# Patient Record
Sex: Male | Born: 1985 | Race: White | Hispanic: No | Marital: Single
Health system: Southern US, Community
[De-identification: ages and names within clinical notes are randomized; demographics above are authoritative.]

---
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
## 2005-04-19 ENCOUNTER — Encounter: Admission: RE | Admit: 2005-04-19 | Discharge: 2005-04-19 | Payer: Self-pay | Admitting: Family Medicine

## 2006-11-27 IMAGING — CT CT HEAD W/O CM
1 series · 16 of 28 positions shown, 20 images · IV contrast (agent unspecified)
Comparison: none

CLINICAL DATA: 19-year-old male with syncope, head trauma, and right headache.  
 HEAD CT WITHOUT CONTRAST:
TECHNIQUE: 5mm collimated images were obtained from the base of the skull through the vertex according to standard protocol without contrast.

[Series 2: brain · axial · 0.49mm/px · z∈[+23,+151]mm · 16 of 28 slices shown, 20 images]
[im 2/28  brain]
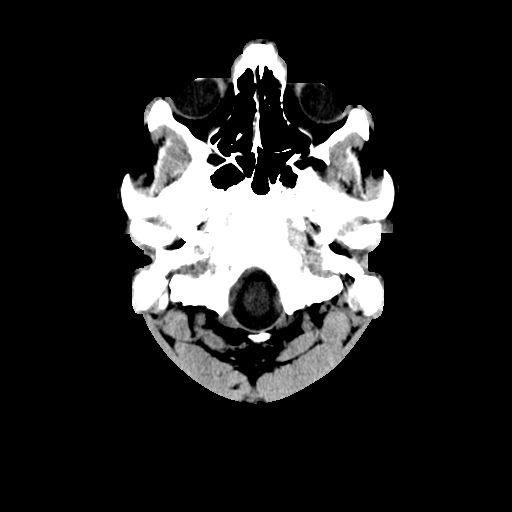
[im 2/28  bone]
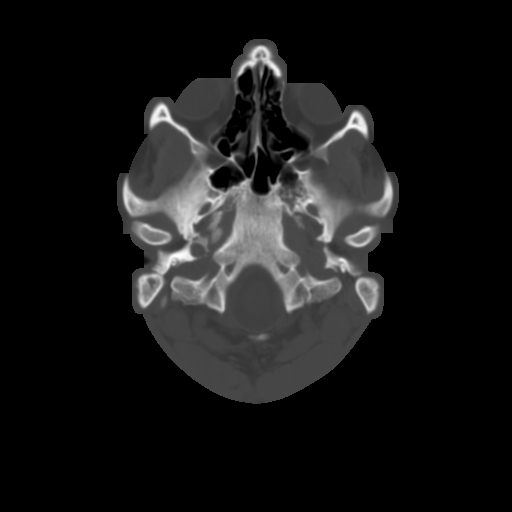
[im 4/28  brain]
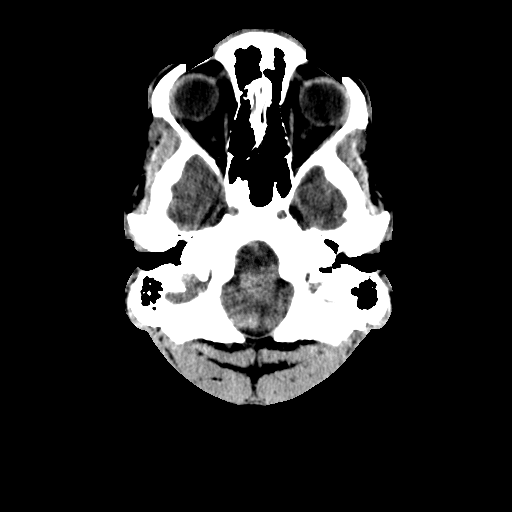
[im 6/28  brain]
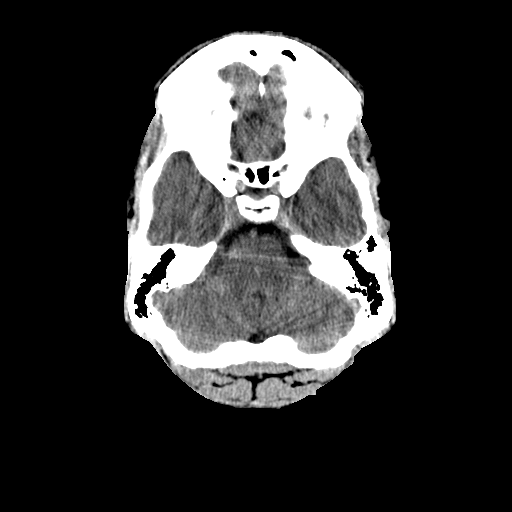
[im 7/28  brain]
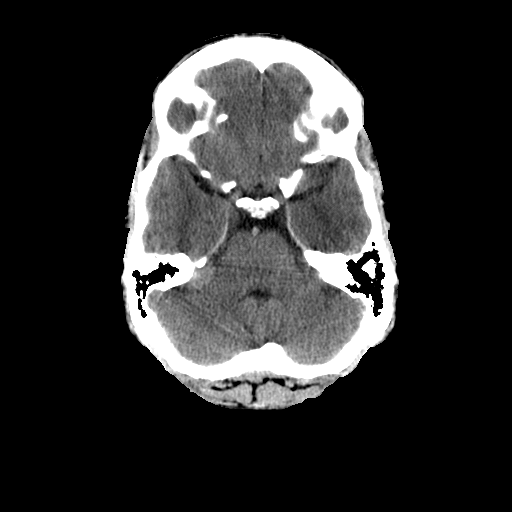
[im 9/28  brain]
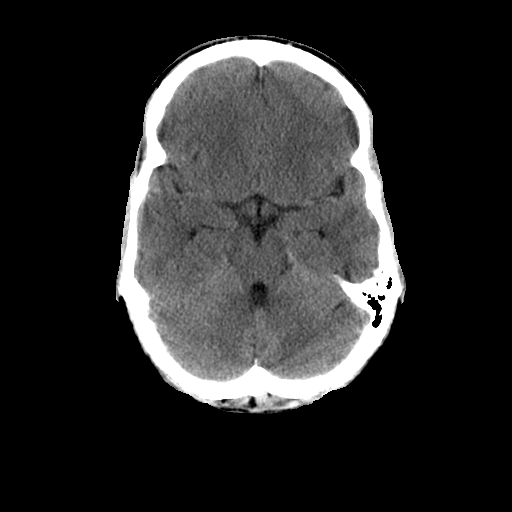
[im 9/28  bone]
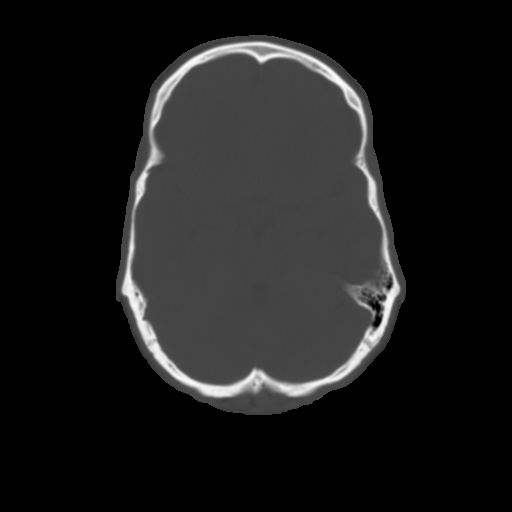
[im 10/28  brain]
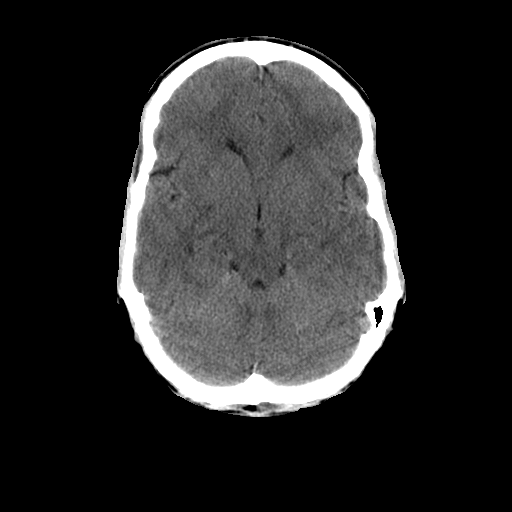
[im 12/28  brain]
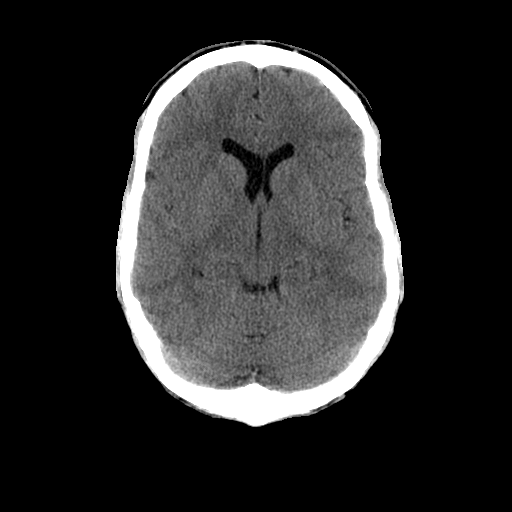
[im 14/28  brain]
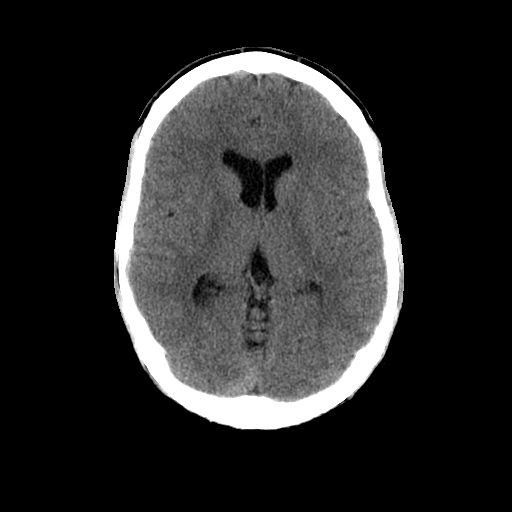
[im 15/28  brain]
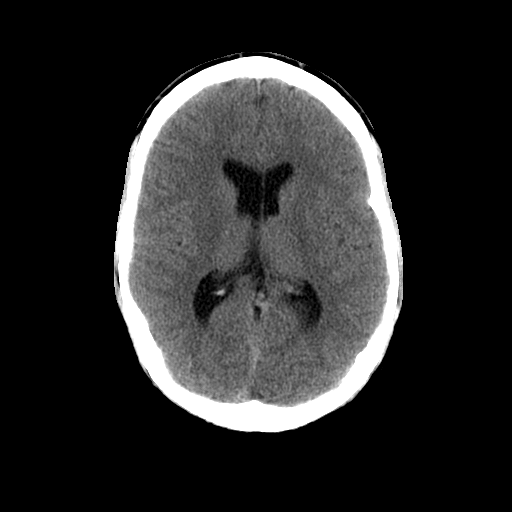
[im 15/28  bone]
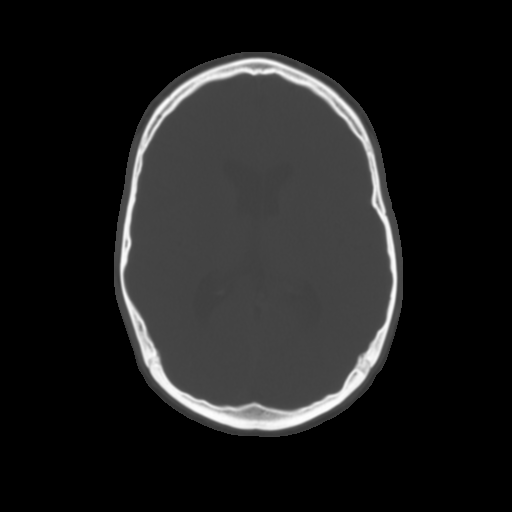
[im 17/28  brain]
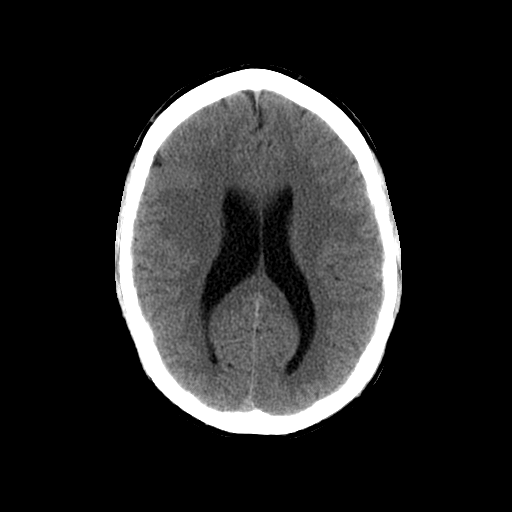
[im 19/28  brain]
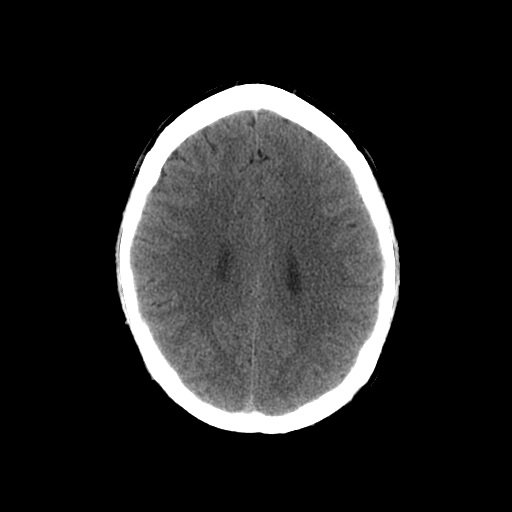
[im 20/28  brain]
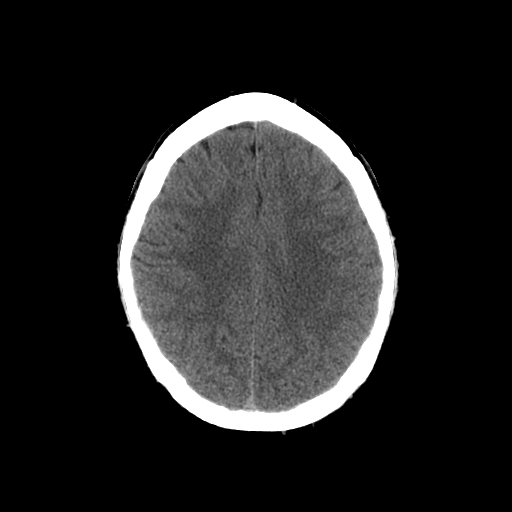
[im 22/28  brain]
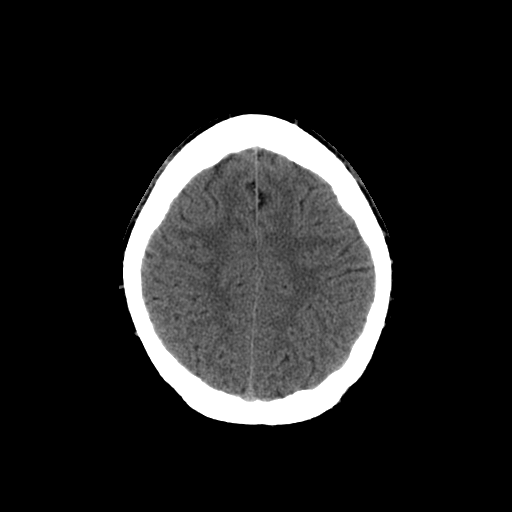
[im 22/28  bone]
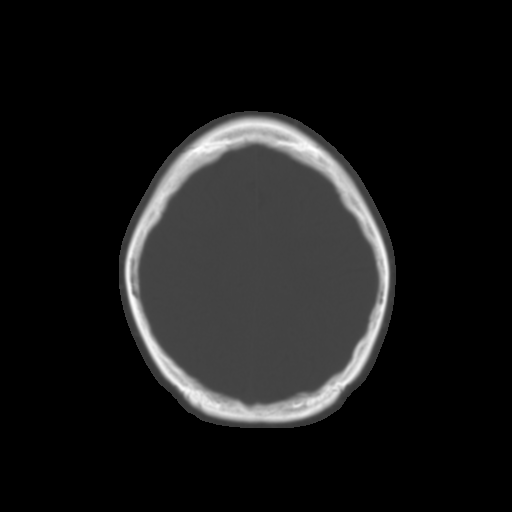
[im 23/28  brain]
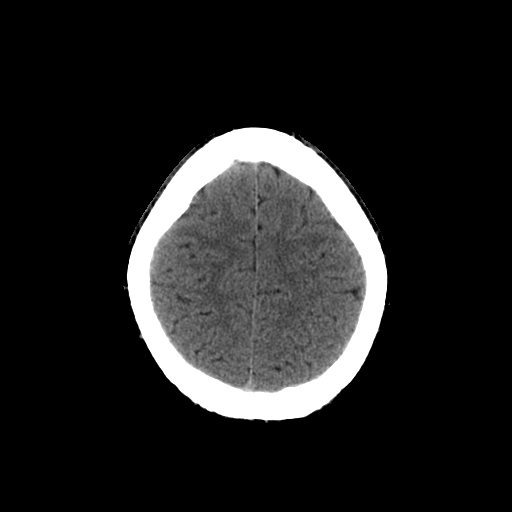
[im 25/28  brain]
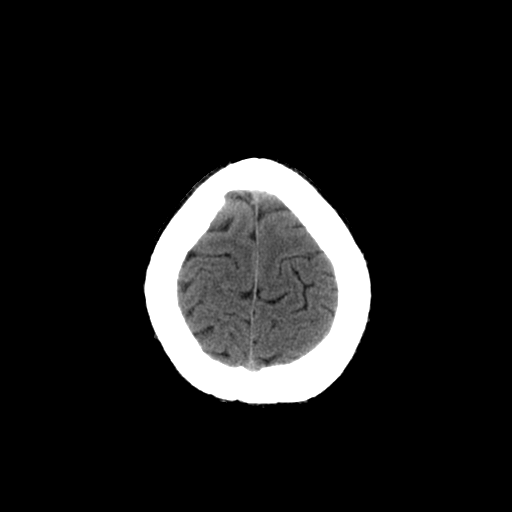
[im 27/28  brain]
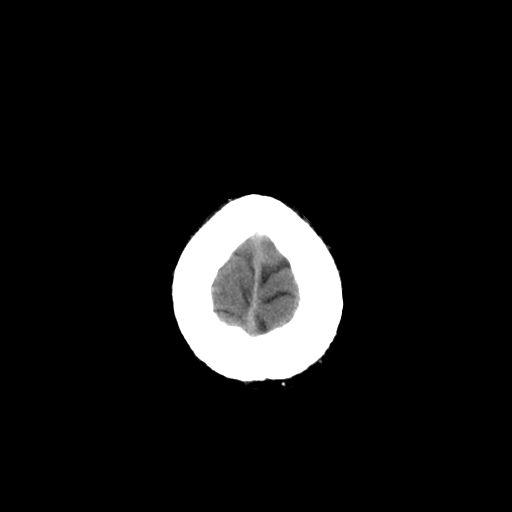

[16 of 28 positions shown; findings below may reference images not displayed]

FINDINGS: No evidence of acute intracranial abnormality including mass or mass effect, hydrocephalus, extra-axial fluid collection, midline shift, hemorrhage, or infarct.  Acute infarct may be missed by CT for 24 to 48 hours.  Visualized bony calvarium and paranasal sinuses are unremarkable.
IMPRESSION: No evidence of acute intracranial abnormality.

## 2008-02-12 ENCOUNTER — Encounter: Admission: RE | Admit: 2008-02-12 | Discharge: 2008-02-12 | Payer: Self-pay | Admitting: Family Medicine

## 2009-09-21 IMAGING — US US SCROTUM
1 series · 14 of 25 positions shown · non-contrast
Comparison: None

CLINICAL DATA: Left testicular nodule

ULTRASOUND OF SCROTUM
TECHNIQUE: Complete ultrasound examination of the testicles,
epididymis, and other scrotal structures was performed.

[Series 1: us scrotum · 0.14mm/px · 14 of 41 slices shown]
[im 1/41]
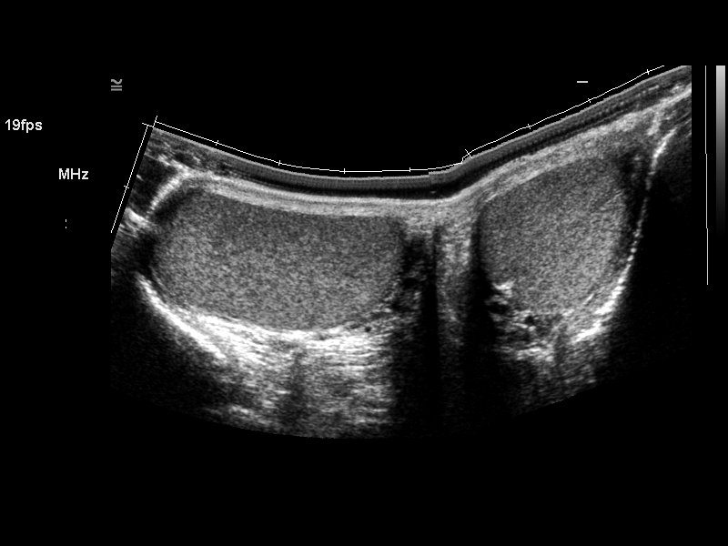
[im 4/41]
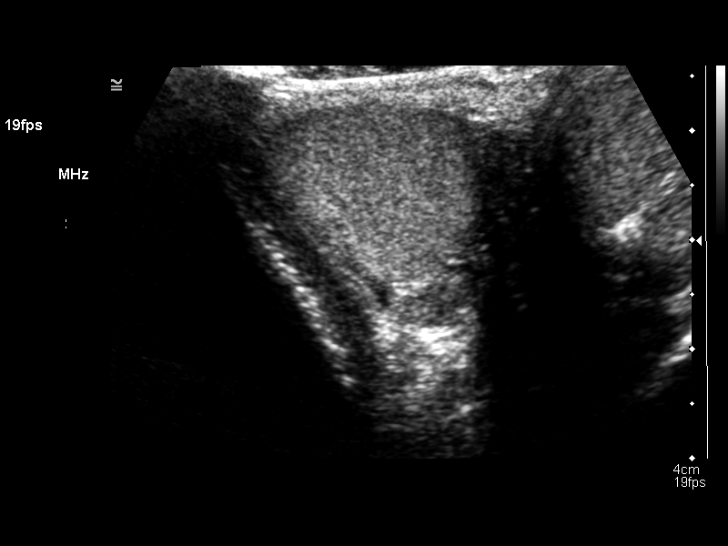
[im 7/41]
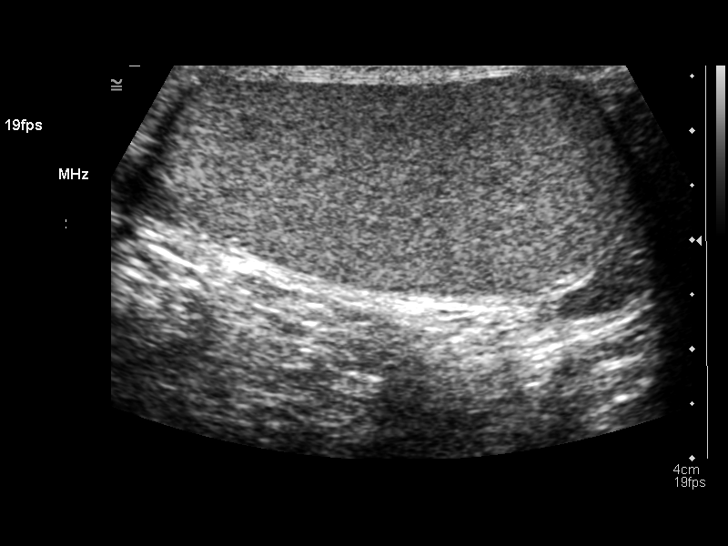
[im 11/41]
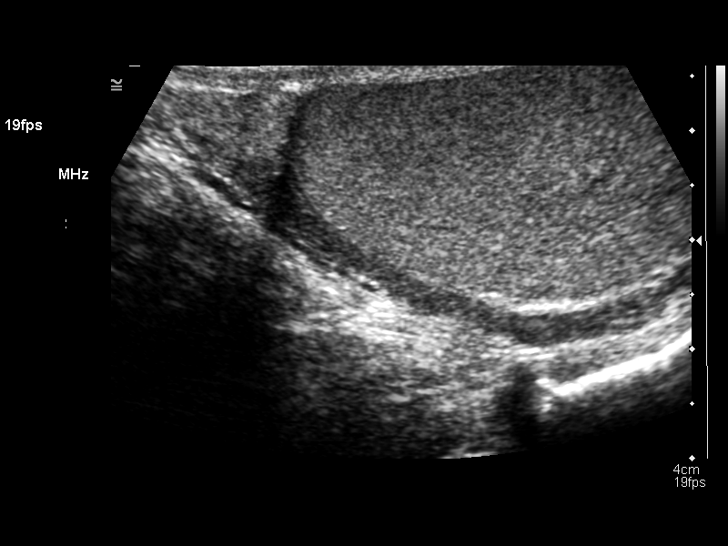
[im 14/41]
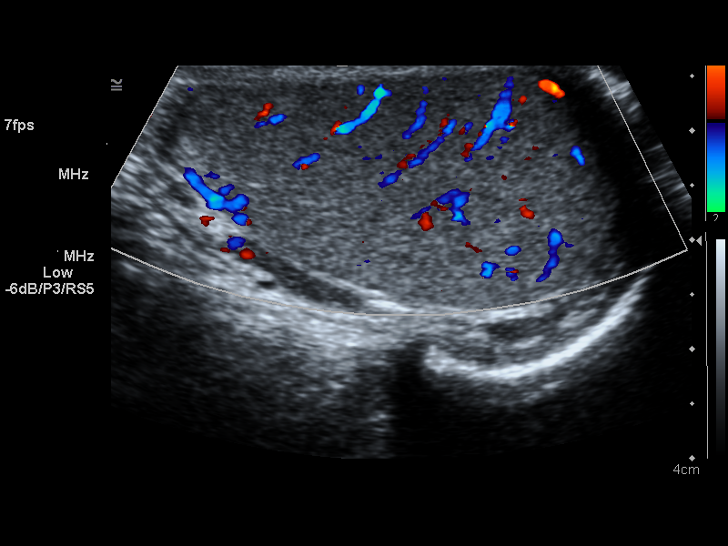
[im 16/41]
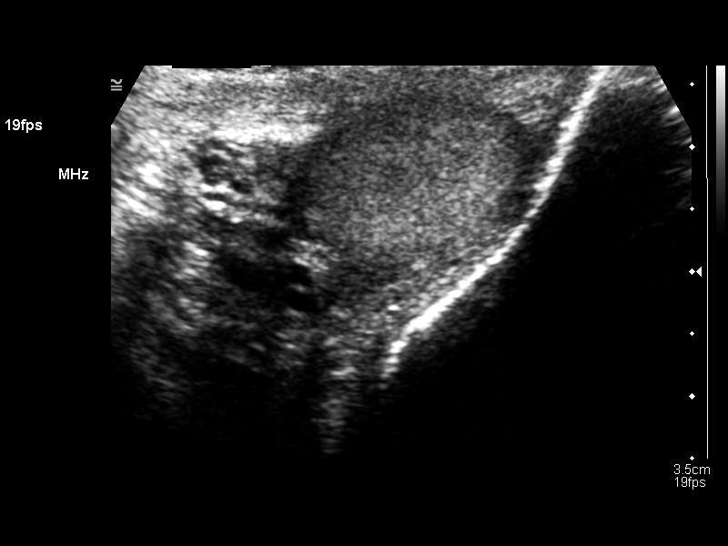
[im 19/41]
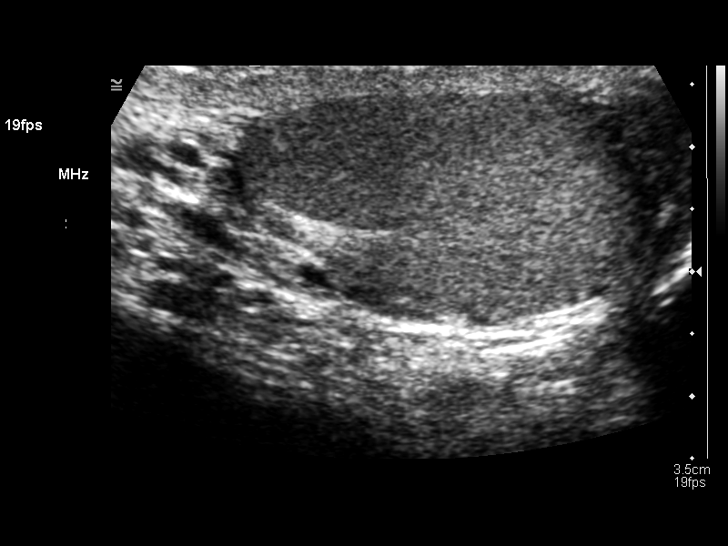
[im 22/41]
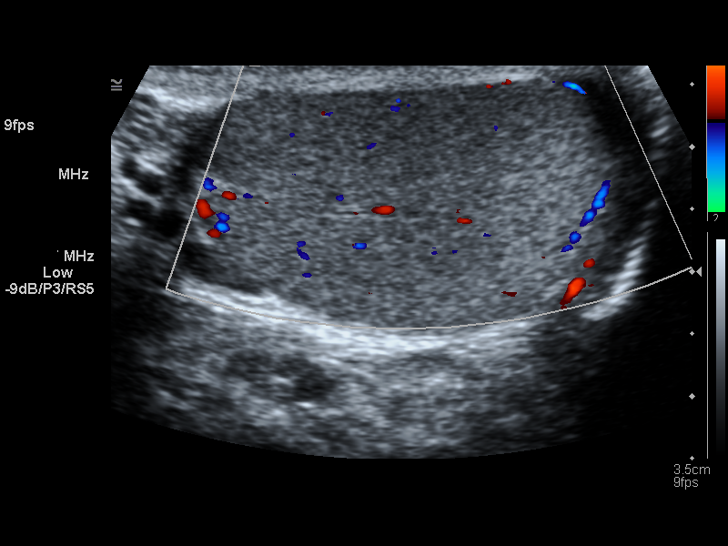
[im 26/41]
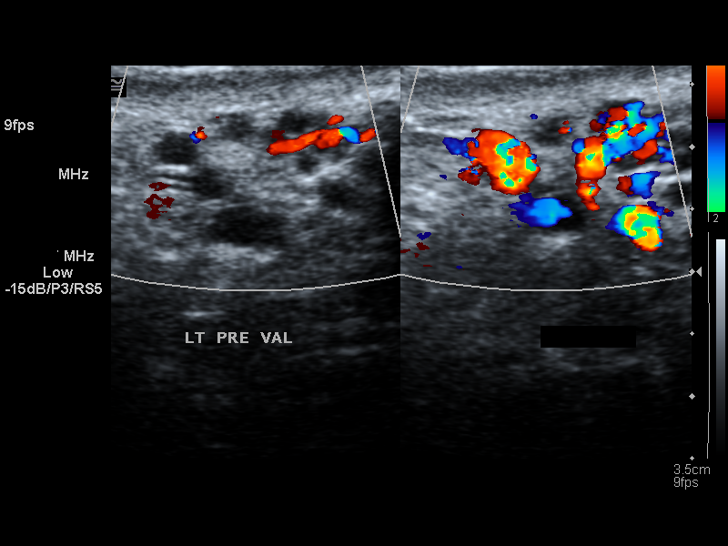
[im 27/41]
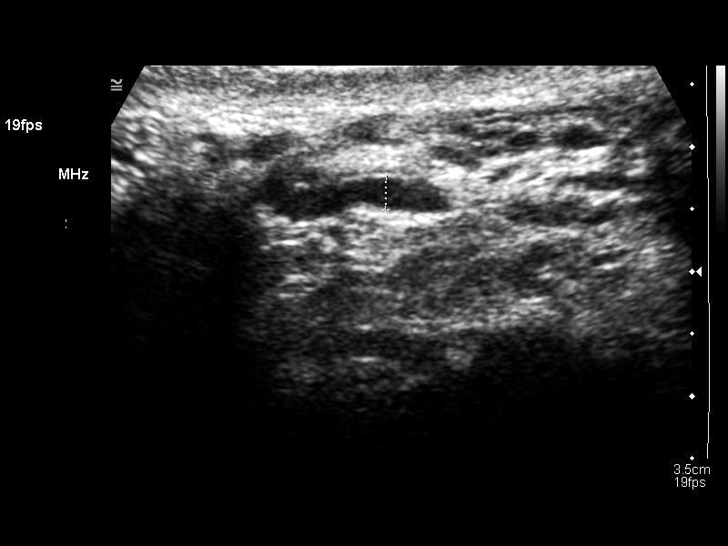
[im 31/41]
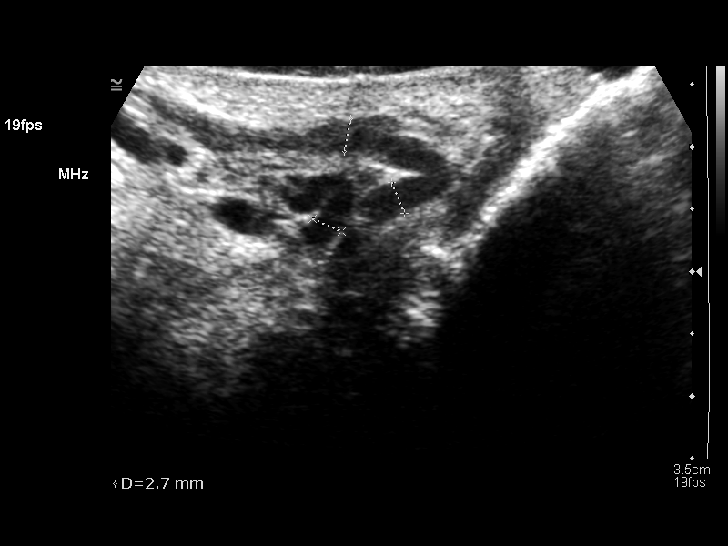
[im 34/41]
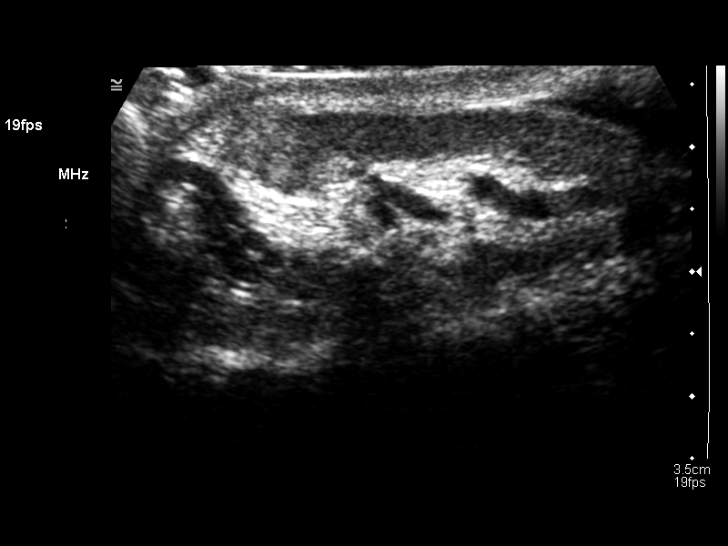
[im 37/41]
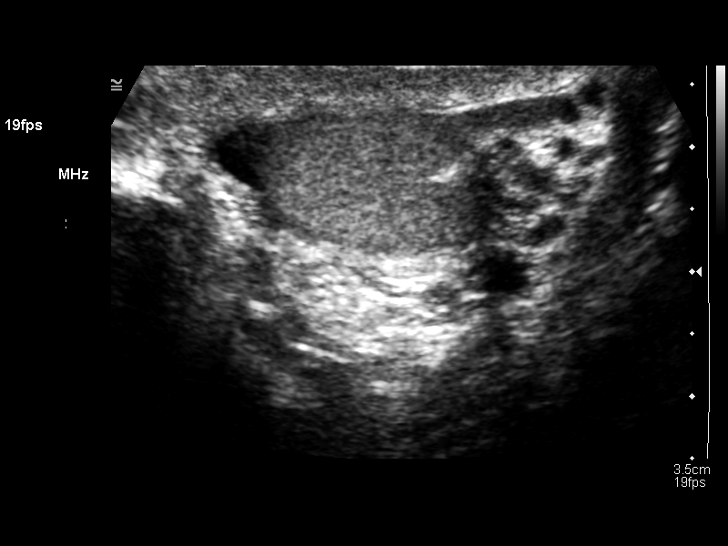
[im 41/41]
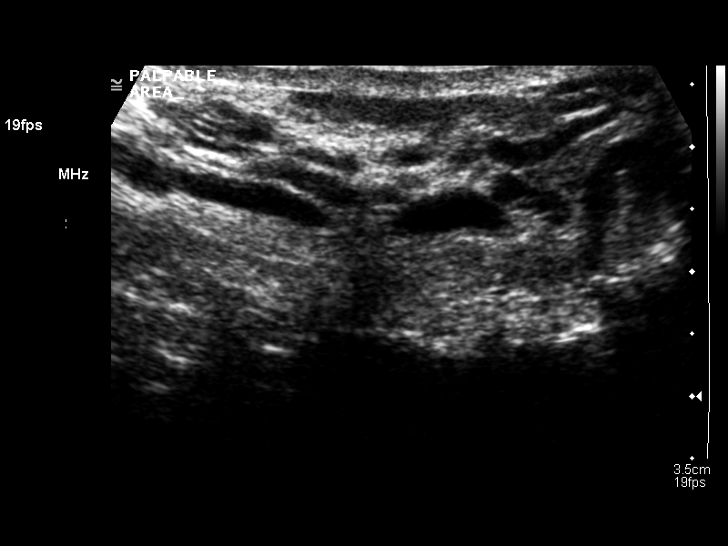

[14 of 25 positions shown; findings below may reference images not displayed]

FINDINGS: Both testicles are normal in size and in echogenicity.
Blood flow is demonstrated to both testicles.  No intratesticular
abnormality is seen.  The epididymis appears normal bilaterally.
No hydrocele is seen.  The does appear to be a varicocele on the
left which augments with Valsalva maneuver.
IMPRESSION: 1.  No intratesticular abnormality.
2.  Left-sided varicocele.

## 2011-03-02 NOTE — Discharge Summary (Signed)
[facility]  Patient:    Shane Rodgers, Shane Rodgers                       MRN: [id number] Adm. Date:  [id number] Disc. Date: [id number] Attending:  Judeth Cornfield Dictator:   Mike Gip, P.A. CC:         Tera Mater Clent Ridges, M.D.   Discharge Summary  ADMITTING DIAGNOSES: 1. [age] year-old white male with acute right lower quadrant abdominal pain    and diarrhea x four days associated with fever.  Rule out acute infectious    colitis, rule out acute presentation of inflammatory bowel disease, rule    out appendicitis. 2. No other known medical problems or illnesses.  DISCHARGE DIAGNOSES:  Resolving acute abdominal pain.  Findings consistent though not diagnostic of Crohns disease versus acute infectious colitis rapidly improving.  CONSULTATIONS:  Patient admitted by Dr. Abbey Chatters, surgery.  Consult Dr. Melvia Heaps, GI.  PROCEDURES:  CT scan of the abdomen and pelvis and colonoscopy with biopsies.  BRIEF HISTORY:  "Shane Rodgers" is a very nice 25 year old white male generally in good health who has a history of childhood asthma but has otherwise been very healthy.  He apparently had acute onset of crampy abdominal pain and diarrhea about four days prior to admission.  This was associated with fever to 101 at the onset of illness, which has not persisted.  He was also noted to have hemoccult positive stool, though had not seen any blood in his stool at home. He had progressively worsening pain which localized to his right lower quadrant and presented to the emergency room after been seen by Dr. Clent Ridges in [place] earlier on the day of admission.  In retrospect, the patients mother states that he has had some intermittent complaints of "stomach ache" over the past several months which appeared to be mild and intermittent and would resolve within one day.  These were generally not associated with diarrhea, and the patient denied any ongoing problems with diarrhea  or abdominal pain.  He says that his appetite is fine, and he has been maintaining his weight and gaining weight normally, as he should at this age.  There is no family history of inflammatory bowel disease.  The patient and the family are unaware of any infectious exposures.  He had not had any prior antibiotics, etc. within the past several months.  LABORATORY STUDIES:  On admission, on August 9, WBC 6.6, hemoglobin 15.1, hematocrit 43.1, MCV 82, platelets 285.  On August 10, WBC 3.7, hemoglobin 14.4, hematocrit of 41.1, and on August 11, WBC 6.1, hemoglobin 13, hematocrit 36.4, differential within normal limits, platelet count of 245.  Sed rate was 7.  Electrolytes on admission within normal limits.  Glucose 156, BUN 9, creatinine 0.7.  Urinalysis was negative.  Stool for WBC showed moderate WBCs. Stool culture negative.  Stool for C. difficile negative.  Stool for O&P negative.  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  There was noted irregularity and narrowing in the terminal ileum, felt to represent Crohns disease and marked thickening and inflammatory changes in the cecum and the ascending colon.  Transverse colon appeared more normal as did the left colon.  He did have some free fluid in the right pericolonic gutter.  HOSPITAL COURSE:  The patient was initially admitted to the service of Dr. Abbey Chatters on the surgical service and then was transferred to the GI service when it was apparent that he did not have an acute abdomen nor an appendicitis.  He was initially placed on IV fluids, kept n.p.o., and started on morphine as needed for pain and scheduled for a CT scan of the abdomen and pelvis to rule out an  appendicitis with findings as outlined above.  The x-rays were reviewed with the radiologists.  They did not feel that he had an appendicitis and did feel that he had evidence most consistent for Crohns colitis and ileitis.  Given his severity of his symptoms, he was started empirically on Asacol 1200 mg p.o. t.i.d. and IV Solu-Medrol at 40 mg q.d.  We also started him on Cipro 500 mg t.i.d. to cover the possibility of an infectious colitis.  The patient, interestingly, rapidly improved.  By August 10, he was feeling better, was using some morphine for pain, but overall feeling better.  He did not have any leukocytosis nor did he have an elevated sed rate.  We advanced his diet very slowly.  By August 13, he was feeling good, had no complaints of abdominal pain and no diarrhea, had remained afebrile.  Stool cultures were all negative, and it was felt best to proceed with colonoscopy during this admission for diagnostic purposes.  He underwent colonoscopy with Dr. Juanda Chance on August 13.  Interestingly, his right colon and terminal ileum appeared normal, and he was felt to have a resolving segmental colitis in the sigmoid colon and left colon.  Several biopsies were taken. Dr. Juanda Chance felt the picture was more consistent with a nonspecific possibly infectious colitis and that these features were not suggestive of Crohns disease.  We advanced his diet.  He tolerated a solid diet without difficulty, and the following morning he was felt stable for discharge to home with instructions to follow-up with Dr. Arlyce Dice on August 23 at 3:00 p.m. and to call if any problems in the interim with increased abdominal pain, diarrhea, cramping, fever, etc.  He was to maintain a low residue diet.  His steroids were discontinued, and he had completed a five day course of Cipro.  This was discontinued as well, and he was discharged to home on Asacol 400 mg 3 p.o. t.i.d.  We will await his final colon biopsies, and we  will also need to check ______  markers on follow-up in the office. DD:  05/28/00 TD:  05/29/00 Job: [id number]  [id number]

## 2011-03-02 NOTE — Procedures (Signed)
[facility]  Patient:    Shane Rodgers, Shane Rodgers                       MRN: [id number] Adm. Date:  [id number] Attending:  Judeth Cornfield CC:         Adolph Pollack, M.D.   Procedure Report  PROCEDURE:  Colonoscopy.  INDICATIONS FOR PROCEDURE:  This 24 year old boy was admitted with acute right lower quadrant abdominal pain and acute diarrheal illness preceded by fever. CT scan suggested thickening of the right colon with fluid in the pelvis and changes suggestive of Crohns disease.  He is now undergoing colonoscopy after several days of IV steroids and ______.  ENDOSCOPE:  Olympus single channel videoscope.  SEDATION:  Versed 7 mg IV.  Demerol 80 mg IV.  FINDINGS:  The Olympus single channel videoscope was passed under direct vision to the region of the sigmoid colon.  The patient was monitored by pulse oximeter.  Oxygen saturations were normal.  The anal canal and rectal ampulla were unremarkable.  At the level of 5 cm from the rectum, the mucosa appeared rather erythematous with edema.  There were multiple mucosal aberrations and hemorrhages, but no ulcerations.  There was no exudate.  These changes of acute colitis extended to the level of 30 cm from the rectum, where they disappeared and the colonic mucosa appeared normal proximally.  Multiple biopsies were taken from the segment of abnormal appearing colon.  The descending colon, splenic flexure, transverse colon and hepatic flexure were entirely normal.  There was no edema, thickening or abnormalities.  The cecal pouch and ileocecal valve were also normal.  The terminal ileum was visualized fully and showed normal appearing mucosa with small lymphoid follicles. Multiple biopsies of the terminal ileum were obtained.  There was no deformity, no aphthous ulcers or anything to suggest Crohns disease.  Random biopsies of the right colon were obtained, as well.  The patient tolerated the procedure  well.  IMPRESSION:  Acute colitis in the sigmoid colon status post biopsies suggestive of infectious colitis.  PLAN:  The endoscopic appearance of the colitis does not seem to suggest Crohns disease or ulcerative colitis, especially since the terminal ileum and the cecum were normal.  The endoscopic findings do not account and do not correlate with the findings on CT scan.  We will await the results of the biopsies, advance the patients diet, discontinue his steroids and continue on ______.  Further evaluation will depend on his response and resolution of his symptoms. DD:  05/27/00 TD:  05/27/00 Job: [id number]

## 2011-03-02 NOTE — H&P (Signed)
[facility]  Patient:    Shane Rodgers, Shane Rodgers                       MRN: [id number] Adm. Date:  [id number] Disc. Date: [id number] Attending:  Tobey Bride CC:         Tera Mater Clent Ridges, M.D.   History and Physical  CHIEF COMPLAINT:  Right lower quadrant abdominal pain with diarrhea.  HISTORY OF PRESENT ILLNESS:  This is an otherwise healthy 25 year old male who four days ago had the onset of crampy, epigastric pain with diarrhea. He has continued to have progression of the pain and radiation to the right lower quadrant. The diarrhea has continued. He has had no nausea and vomiting. He had a fever of 101 on Monday but has had none since. He has got some mild anorexia. He presented to the emergency department at [facility] but then because the wait was so long went home and saw Jeannett Senior A. Clent Ridges, M.D. this morning. He was concerned about appendicitis and asked me to see him. He had a rectal exam done at Dr. Carver Fila office which showed tenderness in the right lower quadrant with heme-positive stool.  PAST MEDICAL HISTORY:  Childhood asthma.  PAST SURGICAL HISTORY:  None.  ALLERGIES:  None.  CURRENT MEDICATIONS:  None.  REVIEW OF SYSTEMS:  CARDIOVASCULAR:  No heart disease or hypertension. PULMONARY:  Except for childhood asthma which has resolved, he has no chronic pulmonary diseases. GI:  No peptic ulcer disease, however, hepatitis. GU:  No kidney stones. NEUROLOGICAL:  No seizure disorders. HEMATOLOGIC:  No deep venous thrombosis or blood transfusion.  SOCIAL HISTORY:  He is a Medical laboratory scientific officer at [employer]. Lives with his parents.  FAMILY HISTORY:  No chronic illnesses in the family.  PHYSICAL EXAMINATION:  GENERAL:  A thin male who is slightly ill-appearing.  VITAL SIGNS:  Blood pressure 143/75, pulse 76, temperature 98.9.  SKIN:  Warm and dry without jaundice.  HEENT:  Eyes:  Extraocular motions intact. Sclerae clear.  NECK:  Supple  without masses.  CARDIOVASCULAR:  Heart demonstrates regular rate and rhythm without a murmur.  RESPIRATORY:  Breath sounds equal and clear. Respirations non-labored.  ABDOMEN:  Soft and flat with right lower quadrant tenderness and guarding to both palpation and percussion. There is positive Rovsing, soas, and obturator signs. Hyperactive bowel sounds are present.  EXTREMITIES:  No cyanosis or edema.  LABORATORY DATA:  White blood cell count is 6600, with no left shift. Urinalysis pending.  IMPRESSION:  Progressive right lower quadrant pain and diarrhea. Differential diagnosis includes nonspecific gastroenteritis versus appendicitis. White blood cell count is normal. The abdomen is soft for the most part, except for some voluntary guarding.  PLAN:  I will obtain a CT scan on thin cuts through the right lower quadrant to rule out appendicitis. If this is positive or if it is equivocal, I would recommend a diagnostic laparoscopy and possible appendectomy. If it is negative, I will discuss this with Jeannett Senior A. Clent Ridges, M.D. and admit him for IV fluid hydration and further workup.DD:  05/23/00 TD:  05/23/00 Job: [id number]

## 2016-12-14 DIAGNOSIS — H00019 Hordeolum externum unspecified eye, unspecified eyelid: Secondary | ICD-10-CM | POA: Diagnosis not present

## 2017-08-12 DIAGNOSIS — J069 Acute upper respiratory infection, unspecified: Secondary | ICD-10-CM | POA: Diagnosis not present

## 2018-03-31 DIAGNOSIS — Z0000 Encounter for general adult medical examination without abnormal findings: Secondary | ICD-10-CM | POA: Diagnosis not present

## 2018-03-31 DIAGNOSIS — E7800 Pure hypercholesterolemia, unspecified: Secondary | ICD-10-CM | POA: Diagnosis not present

## 2018-04-22 DIAGNOSIS — H60391 Other infective otitis externa, right ear: Secondary | ICD-10-CM | POA: Diagnosis not present

## 2018-07-04 DIAGNOSIS — E7800 Pure hypercholesterolemia, unspecified: Secondary | ICD-10-CM | POA: Diagnosis not present

## 2018-08-22 DIAGNOSIS — Z23 Encounter for immunization: Secondary | ICD-10-CM | POA: Diagnosis not present

## 2019-07-13 ENCOUNTER — Other Ambulatory Visit: Payer: Self-pay

## 2019-07-13 DIAGNOSIS — Z20822 Contact with and (suspected) exposure to covid-19: Secondary | ICD-10-CM

## 2019-07-15 LAB — NOVEL CORONAVIRUS, NAA: SARS-CoV-2, NAA: DETECTED — AB

## 2019-07-22 ENCOUNTER — Other Ambulatory Visit: Payer: Self-pay

## 2019-07-22 DIAGNOSIS — Z20822 Contact with and (suspected) exposure to covid-19: Secondary | ICD-10-CM

## 2019-07-24 LAB — NOVEL CORONAVIRUS, NAA: SARS-CoV-2, NAA: NOT DETECTED

## 2023-03-07 ENCOUNTER — Other Ambulatory Visit: Payer: Self-pay | Admitting: Family Medicine

## 2023-03-07 ENCOUNTER — Ambulatory Visit
Admission: RE | Admit: 2023-03-07 | Discharge: 2023-03-07 | Disposition: A | Discharge status: Home / Self Care | Payer: 59 | Source: Ambulatory Visit | Attending: Family Medicine | Admitting: Family Medicine

## 2023-03-07 DIAGNOSIS — M79674 Pain in right toe(s): Secondary | ICD-10-CM

## 2023-03-07 DIAGNOSIS — M25571 Pain in right ankle and joints of right foot: Secondary | ICD-10-CM

## 2023-03-13 ENCOUNTER — Other Ambulatory Visit: Payer: Self-pay

## 2023-03-13 ENCOUNTER — Ambulatory Visit (INDEPENDENT_AMBULATORY_CARE_PROVIDER_SITE_OTHER): Payer: 59 | Admitting: Sports Medicine

## 2023-03-13 VITALS — BP 128/74 | Ht 73.0 in | Wt 195.0 lb

## 2023-03-13 DIAGNOSIS — M25571 Pain in right ankle and joints of right foot: Secondary | ICD-10-CM

## 2023-03-13 DIAGNOSIS — M109 Gout, unspecified: Secondary | ICD-10-CM | POA: Diagnosis not present

## 2023-03-13 MED ORDER — PREDNISONE 10 MG PO TABS: ORAL_TABLET | ORAL | 0 refills | Status: AC

## 2023-03-13 NOTE — Progress Notes (Signed)
Shane Rodgers - 37 y.o. male MRN [id number]  Date of birth: 10/01/1986    CHIEF COMPLAINT:   R Great Toe Pain and R ankle pain    SUBJECTIVE:   HPI:  Pleasant pleasant 37 year old male here as a new patient for right ankle pain and R toe pain.  About 3 weeks ago he sprained his ankle while playing tennis.  The ankle has slowly gotten better.  A few days after that he developed some pain in his right great toe.  The toe became red and swollen and tender.  This happened while he was on a business trip to [place] eating seafood and drinking alcohol.  He had x-rays of the toe and ankle that were negative when he got home.  He saw his primary care doctor who diagnosed him with gout via telehealth visit.  He had a negative uric acid level.  He started on colchicine then after few days of that he was switched to prednisone.  Over the last week the pain and swelling have improved significantly.  The pain is a lot more manageable now.  He wants to figure out the best way to treat this moving forward.  ROS:     See HPI  PERTINENT  PMH / PSH FH / / SH:  Past Medical, Surgical, Social, and Family History Reviewed & Updated in the EMR.  Pertinent findings include:  none  OBJECTIVE: BP 128/74   Ht 6\' 1"  (1.854 m)   Wt 195 lb (88.5 kg)   BMI 25.73 kg/m   Physical Exam:  Vital signs are reviewed.  GEN: Alert and oriented, NAD Pulm: Breathing unlabored PSY: normal mood, congruent affect  MSK: R Foot -there is obvious erythema and swelling at the right first toe IP joint.  There is no obvious tophi.  He is tender to palpation there.  He is nontender to palpation at the MTP joint.  Range of motion is limited by pain.  R Ankle - no obvious deformity.  Nontender palpation at the posterior tip of the lateral malleolus, posterior to the medial malleolus, navicular, base of fifth metatarsal.  Full range of motion ankle in all directions.  5/5 strength throughout.  Negative anterior drawer test.  Mild pain  with talar tilt test.  ULTRASOUND: Limited ultrasound of right great toe The IP joint was visualized in both long and short axis.  No cortical irregularities at the shaft of the phalanx.  There is a large effusion at the joint.  There is some scattered hyperechoic signals intermixed in the effusion.  There is a slight hyperechoic line over the proximal phalanx.  There is some small neovascularization.  Limited ultrasound of right ankle [place] R ankle:  - Peroneus longus and brevis: Evaluated in longitudinal transverse planes. Intact no evidence of tenosynovitis. - Lateral ligaments: ATFL intact - Achilles tendon: Tendon intact, normal appearance  Impression: Changes indicative of gout at the right first IP joint with hyperechoic crystals and a double contour sign  Unremarkable ultrasound of right ankle  Interpreted by myself and Dr. Pearletha Forge on 03/13/2023  ASSESSMENT & PLAN:  1. Gout of right 1st great toe -Clinical presentation is consistent with initial gout flare of the right first toe.  He has noticed improvement with prednisone Dosepak.  Will call him in a second prednisone Dosepak to take if symptoms regress.  Will check on this again in about another week or so.  If the second Dosepak does not help, I would try an intra-articular  corticosteroid injection under ultrasound guidance into the first IP joint.  I would not start him on allopurinol after this being his first flare.  However, we did discuss dietary choices such as avoiding red meats and alcohol and high purine foods to avoid flares in the future.  Allopurinol could be an option as a preventative measure in the future if flares become recurrent.  If he is improved after 2 weeks then he can cancel his follow-up appointment in see [place] on an as-needed basis.  2. Right Ankle Sprain -Unrelated to the gout picture above.  Ultrasound shows an intact ATFL.  This is getting better.  Can get into sports as tolerated again.  Follow-up with [place]  as needed for the ankle.  Arvella Nigh, MD PGY-4, Sports Medicine Fellow [facility]  Addendum:  I was the preceptor for this visit and available for immediate consultation.  Norton Blizzard MD Marrianne Mood

## 2023-03-27 ENCOUNTER — Ambulatory Visit: Payer: 59 | Admitting: Sports Medicine

## 2024-07-16 ENCOUNTER — Encounter: Payer: Self-pay | Admitting: Podiatry

## 2024-07-16 ENCOUNTER — Ambulatory Visit (INDEPENDENT_AMBULATORY_CARE_PROVIDER_SITE_OTHER)

## 2024-07-16 ENCOUNTER — Ambulatory Visit (INDEPENDENT_AMBULATORY_CARE_PROVIDER_SITE_OTHER): Admitting: Podiatry

## 2024-07-16 DIAGNOSIS — M76822 Posterior tibial tendinitis, left leg: Secondary | ICD-10-CM | POA: Diagnosis not present

## 2024-07-16 DIAGNOSIS — M7750 Other enthesopathy of unspecified foot: Secondary | ICD-10-CM

## 2024-07-16 DIAGNOSIS — M7751 Other enthesopathy of right foot: Secondary | ICD-10-CM | POA: Diagnosis not present

## 2024-07-16 DIAGNOSIS — M76821 Posterior tibial tendinitis, right leg: Secondary | ICD-10-CM | POA: Diagnosis not present

## 2024-07-16 MED ORDER — TRIAMCINOLONE ACETONIDE 10 MG/ML IJ SUSP
5.0000 mg | Freq: Once | INTRAMUSCULAR | Status: AC
  Administered 2024-07-16: 5 mg

## 2024-07-16 MED ORDER — MELOXICAM 15 MG PO TABS
15.0000 mg | ORAL_TABLET | Freq: Every day | ORAL | 0 refills | Status: AC
Start: 2024-07-16 — End: ?

## 2024-07-16 NOTE — Progress Notes (Signed)
 Chief Complaint  Patient presents with   Foot Pain    I think my PTTD has flared up again.  My main problem is the area on the ball of my foot under the second toe.   N - pain ball of foot and on the side L - medial foot, 2nd met D - 6 mos O - suddenly, gotten worse C - swelling, feel like a pebble under foot, throbbing and shooting pain A - playing tennis T - wear my orthotics, cold therapy, elevation, NSAIDS    HPI: 38 y.o. male presents today with several pedal complaints.  Reports pain to the right forefoot underneath the second MPJ.  Reports throbbing sensation associated with this feeling like stepping on a pebble.  He also does report history of PTTD, does wear orthotics, does not like this is flaring up and reports some pain around the medial ankle right foot.  History reviewed. No pertinent past medical history.  History reviewed. No pertinent surgical history.  No Known Allergies  ROS denies any nausea, vomiting, fever, chills, chest pain, shortness of breath   Physical Exam: There were no vitals filed for this visit.  General: The patient is alert and oriented x3 in no acute distress.  Dermatology: Skin is warm, dry and supple bilateral lower extremities. Interspaces are clear of maceration and debris.    Vascular: Palpable pedal pulses bilaterally. Capillary refill within normal limits.  No appreciable edema.  No erythema or calor.  Neurological: Light touch sensation grossly intact bilateral feet.   Musculoskeletal Exam: Muscle strength 5/5 for all major muscle groups.  Pes planus foot type noted.  Tenderness on palpation of second MPJ dorsally and plantarly right foot.  Some mild increase of Lachman sign.  Tenderness on palpation of right PT tendon.  Radiographic Exam: Right foot and ankle views 07/16/2024 Normal osseous mineralization. Joint spaces preserved.  No fractures or osseous irregularities noted.  Pes planus foot type noted.  Assessment/Plan  of Care: 1. Posterior tibial tendon dysfunction (PTTD) of both lower extremities   2. Capsulitis of metatarsophalangeal (MTP) joint of right foot   3. Tendinitis of ankle      Meds ordered this encounter  Medications   meloxicam (MOBIC) 15 MG tablet    Sig: Take 1 tablet (15 mg total) by mouth daily.    Dispense:  30 tablet    Refill:  0   triamcinolone acetonide (KENALOG) 10 MG/ML injection 5 mg   None  Discussed clinical findings with patient today.  Radiographs reviewed with patient  # Right second MPJ capsulitis - Recommend course of oral meloxicam - Discussed RICE therapy - Scaled back high-impact activity - Use of metatarsal pads to offload forefoot - Demonstrated MPJ capsulitis taping.  Instructed patient to maintain this using athletic tape ripped into half-inch strips consistently over the next several weeks - Corticosteroid injection to the right second MPJ as described below  # Right sided PTTD - Continue use of orthotics - Do recommend over-the-counter lace up ankle brace - Home rehab exercises discussed with patient with written instructions dispensed - Scale back high-impact activity - NSAIDs as described above - RICE therapy  Procedure: Injection right second metatarsophalangeal joint Discussed alternatives, risks, complications and verbal consent was obtained.  Location: Right second metatarsophalangeal joint. Skin Prep: Betadine. Injectate: Local field block administered prior to the injection site consisting of 3 cc of one-to-one ratio of 2% lidocaine plain 0.5% Marcaine plain proximal to the MPJ.  Betadine skin prep.  Injected 0.25 cc of 0.5% Marcaine plain, 5 mg Kenalog, 0.25 cc dexamethasone into right second MPJ dorsal approach. Disposition: Patient tolerated procedure well. Injection site dressed with a band-aid.  Post-injection care was discussed and return precautions discussed.    Reevaluate in about 3 to 4 weeks.   Devarious Pavek L. Lamount MAUL,  AACFAS [facility]     [address] [place], [place] [address]                Office [phone]  Fax [phone]

## 2024-07-16 NOTE — Patient Instructions (Signed)
 Look for metatarsal pads on [employer] for the ball of your foot.  You can tape the second toe like we discussed using half-inch ribbon's of athletic tape using 4-6 strips in total.  Look up second MPJ capsulitis taping technique on YouTube for a demonstration.

## 2024-08-07 ENCOUNTER — Ambulatory Visit: Admitting: Podiatry

## 2024-08-21 ENCOUNTER — Encounter: Payer: Self-pay | Admitting: Internal Medicine
